# Patient Record
Sex: Male | Born: 1980 | Race: Black or African American | Hispanic: No
Health system: Southern US, Community
[De-identification: ages and names within clinical notes are randomized; demographics above are authoritative.]

---
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
## 2004-06-28 ENCOUNTER — Emergency Department (HOSPITAL_COMMUNITY): Admission: EM | Admit: 2004-06-28 | Discharge: 2004-06-28 | Payer: Self-pay | Admitting: Emergency Medicine

## 2004-09-24 ENCOUNTER — Emergency Department (HOSPITAL_COMMUNITY): Admission: EM | Admit: 2004-09-24 | Discharge: 2004-09-24 | Payer: Self-pay | Admitting: Emergency Medicine

## 2005-03-10 ENCOUNTER — Emergency Department (HOSPITAL_COMMUNITY): Admission: EM | Admit: 2005-03-10 | Discharge: 2005-03-10 | Payer: Self-pay | Admitting: Emergency Medicine

## 2005-06-28 ENCOUNTER — Emergency Department (HOSPITAL_COMMUNITY): Admission: EM | Admit: 2005-06-28 | Discharge: 2005-06-28 | Payer: Self-pay | Admitting: Family Medicine

## 2006-02-18 ENCOUNTER — Emergency Department (HOSPITAL_COMMUNITY): Admission: EM | Admit: 2006-02-18 | Discharge: 2006-02-18 | Payer: Self-pay | Admitting: Emergency Medicine

## 2006-11-04 ENCOUNTER — Emergency Department (HOSPITAL_COMMUNITY): Admission: EM | Admit: 2006-11-04 | Discharge: 2006-11-04 | Payer: Self-pay | Admitting: Emergency Medicine

## 2007-06-04 ENCOUNTER — Emergency Department (HOSPITAL_COMMUNITY): Admission: EM | Admit: 2007-06-04 | Discharge: 2007-06-04 | Payer: Self-pay | Admitting: Emergency Medicine

## 2007-09-02 ENCOUNTER — Emergency Department (HOSPITAL_COMMUNITY): Admission: EM | Admit: 2007-09-02 | Discharge: 2007-09-02 | Payer: Self-pay | Admitting: Emergency Medicine

## 2007-10-16 ENCOUNTER — Emergency Department (HOSPITAL_COMMUNITY): Admission: EM | Admit: 2007-10-16 | Discharge: 2007-10-16 | Payer: Self-pay | Admitting: Emergency Medicine

## 2009-11-28 IMAGING — CR DG HAND COMPLETE 3+V*R*
3 series · 3 of 3 positions shown · non-contrast
Comparison: 06/04/2007.

CLINICAL DATA: Right hand injury.  History of prior fracture.

RIGHT HAND - COMPLETE 3+ VIEW

[view not recorded (1 of 3)]
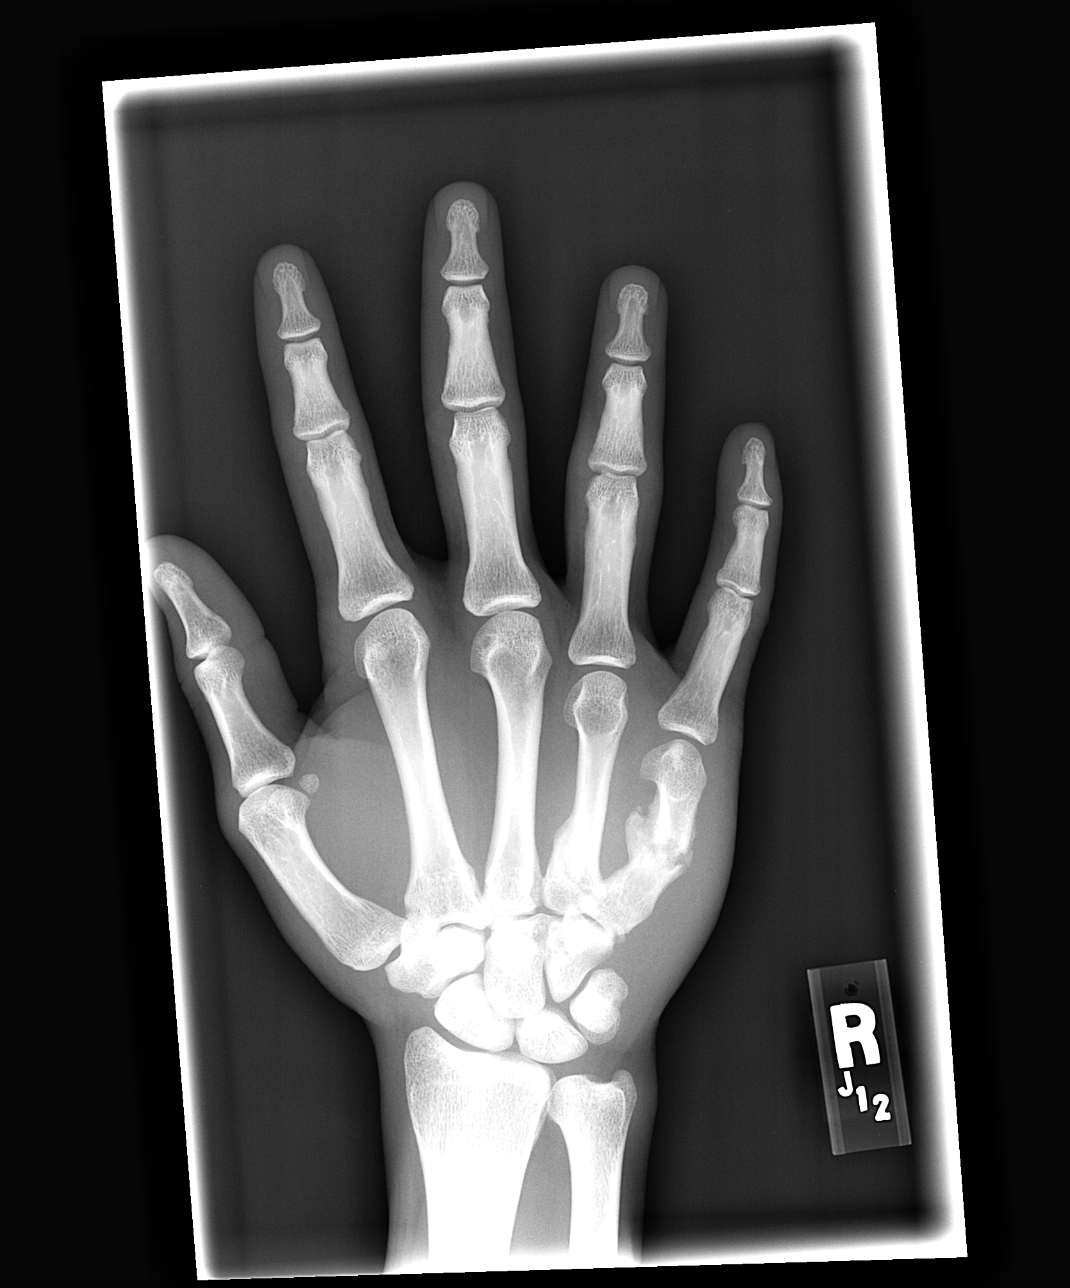

[view not recorded (2 of 3)]
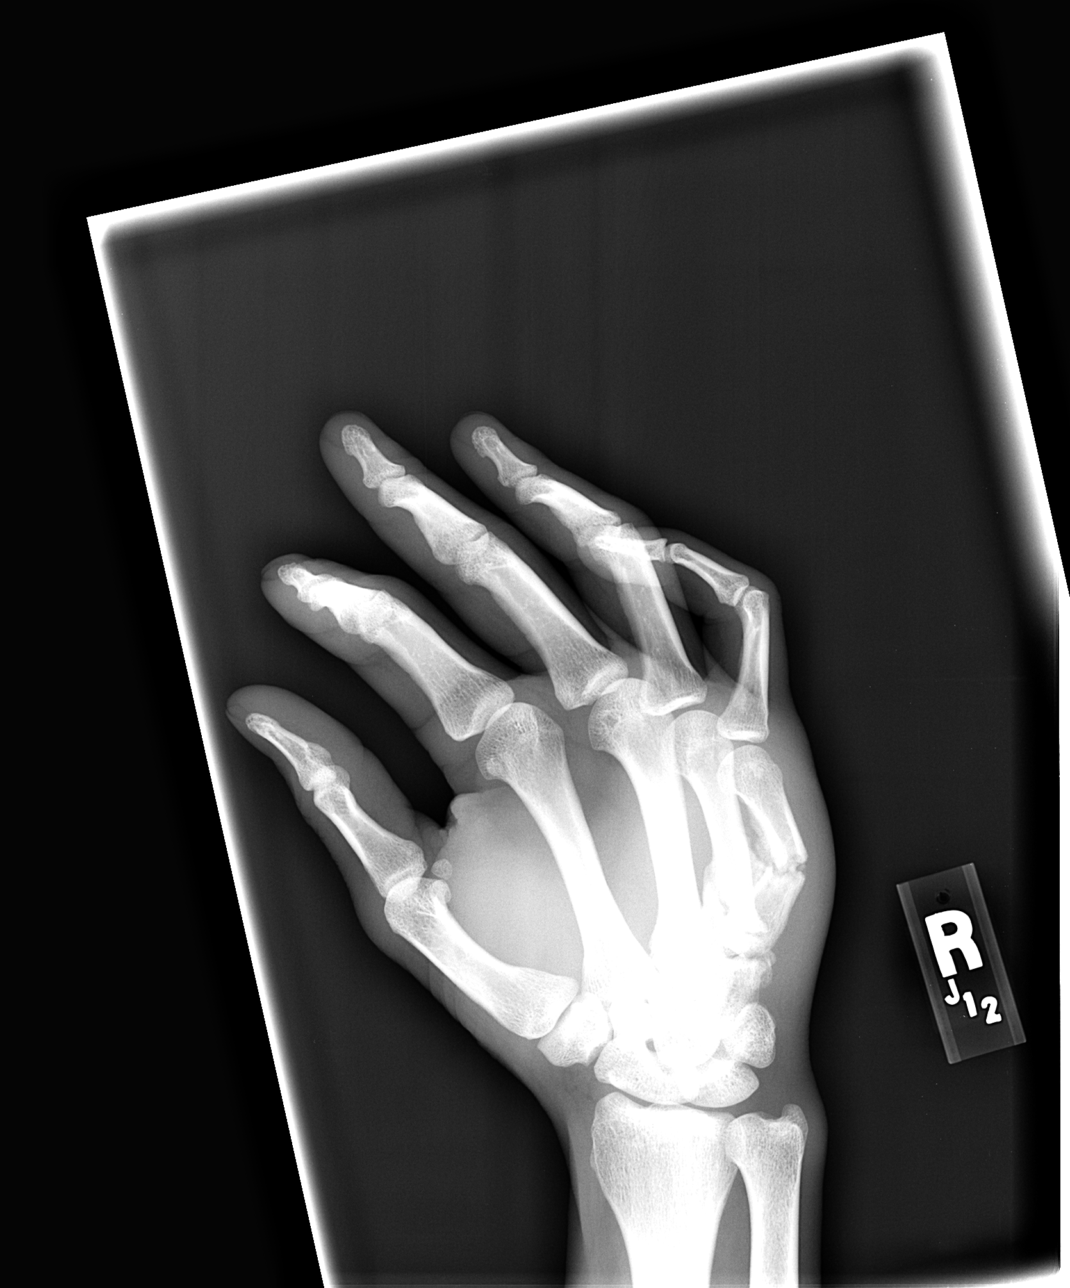

[view not recorded (3 of 3)]
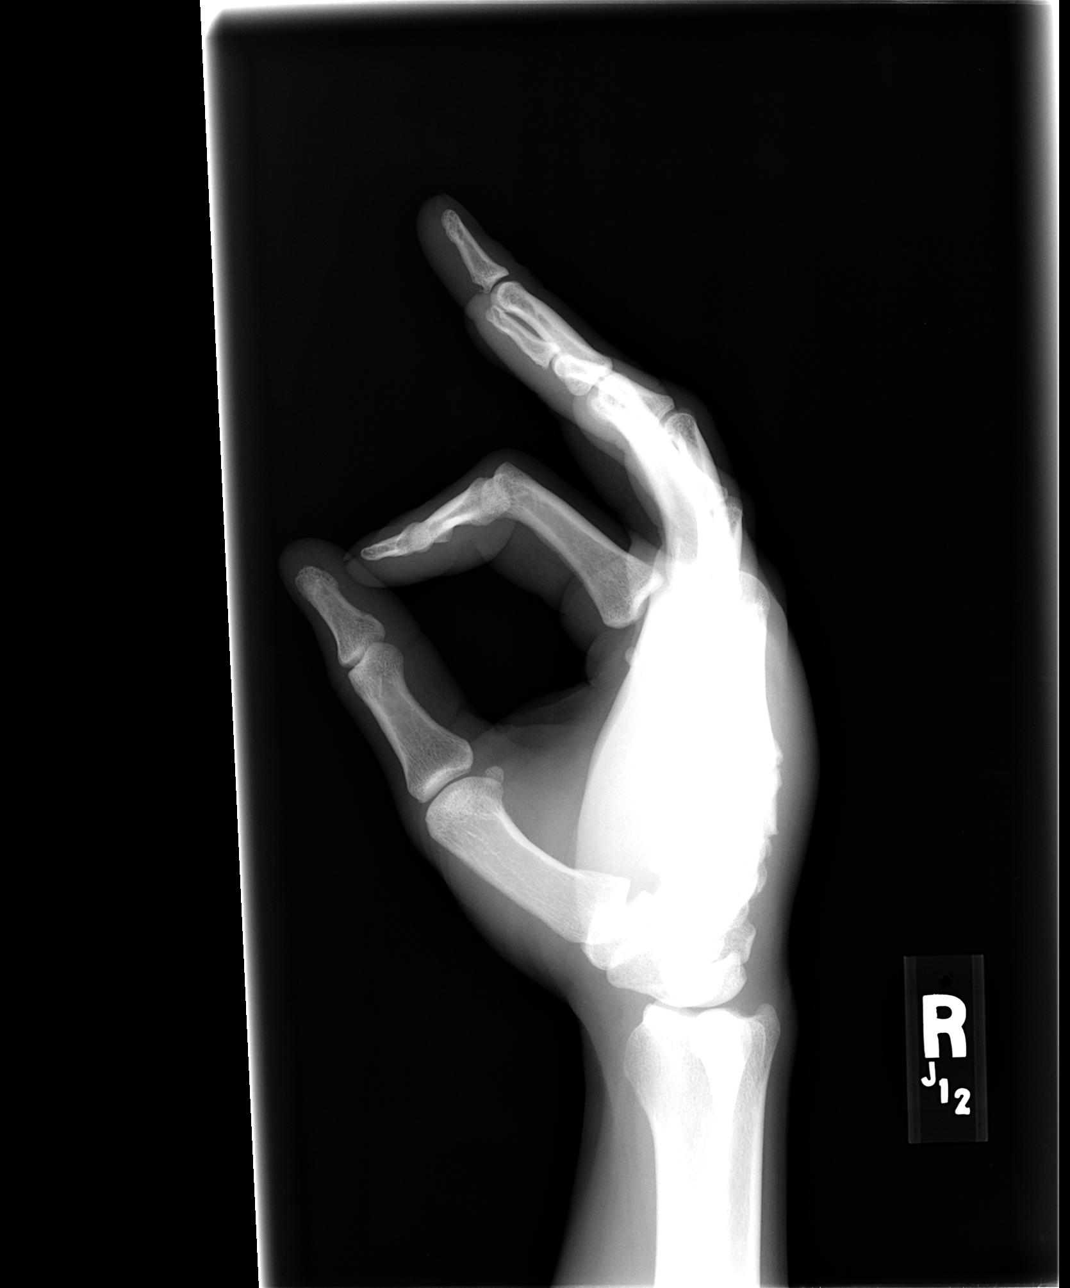

[3 of 3 positions shown; findings below may reference images not displayed]

FINDINGS: Remote fracture at the base of the right fourth
metacarpal.  Fracture of the right fifth metacarpal mid shaft noted
on the 06/04/2007 examination has not completely healed
radiographically with angulation and prominent bony overgrowth.
Otherwise no acute fracture or dislocation is noted.
IMPRESSION: Prior fractures right fourth and fifth metacarpal as discussed
above.

## 2010-08-08 NOTE — Op Note (Signed)
NAMEDRESDEN, LOZITO NO.:  [id number]   MEDICAL RECORD NO.:  [id number]          PATIENT TYPE:  EMS   LOCATION:  [facility]                         FACILITY:  [facility]   PHYSICIAN:  Juan Stokes, M.D.DATE OF BIRTH:  21-Jul-1980   DATE OF PROCEDURE:  11/04/2006  DATE OF DISCHARGE:                               OPERATIVE REPORT   HISTORY:  Juan Stokes presented to the emergency room in regards to  his right hand.  This patient is 30 year old right-hand-dominant male  who was referred by Juan Stokes in regards to his upper extremity  predicament.  The patient fell on a hearth tonight while rough-housing  with friends and sustained a fracture-dislocation of his 5th  carpometacarpal joint and 4th carpometacarpal joint.  The patient has a  significant deformity, has displacement of the 4th and 5th CMC joints.  He denies Stokes injury.  He has had no left upper extremity complaints.  I reviewed these findings at length.   At the present time, he is in severe pain.  He has difficulty evaluating  the deep motor branch to the ulnar nerve due to his pain.   ALLERGIES:  None.   MEDICINES:  None.   PAST SURGICAL HISTORY:  None.   PAST MEDICAL HISTORY:  None.   SOCIAL HISTORY:  The patient does not use any illicit drugs.  He  occasionally enjoys an alcoholic beverage and smokes one pack per day.  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   PHYSICAL EXAMINATION:  GENERAL:  He is alert and oriented, in no acute  distress.  VITAL SIGNS:  Stable.  LOWER EXTREMITIES:  The patient has a normal lower extremity  examination.  CHEST:  Clear.  HEART:  Regular rate.  UPPER EXTREMITIES:  The patient has intact sensation to the thumb,  index, middle, ring and small finger, no evidence of compartment  syndrome.  He has severe soft tissue deformity and a dorsal dislocation  of the 5th and 4th metacarpal.  The patient has had noted traumatic  injury here with the 4th and 5th  fracture dislocations of the [facility] joint.   I have reviewed his findings at length.  X-rays show a 4th and 5th  fracture dislocation.  He has severe deformity and dislocation is quite  prominent about the [facility] joint.   IMPRESSION:  Fracture dislocation, 4th and 5th carpometacarpal joint,  right hand (2 separate metacarpal fracture dislocations about the right  hand secondary to traumatic injury).   PLAN:  I have discussed the patient's findings.  At the present time, he  is in need of reduction and placement of the multiple bones and  dislocations back into an acceptable alignment.   DESCRIPTION OF PROCEDURE:  I consented him and following this, he  underwent an ulnar nerve block and superficial dorsal sensory ulnar  nerve block with lidocaine without epinephrine under sterile condition.  Once the wrist block was performed by myself, he then underwent  intervention in the form of a closed reduction.  The patient underwent a  closed reduction of the 4th followed by the 5th [facility]  joints.  The patient  had smooth reduction without difficulty and no iatrogenic injury was  imparted onto the area and the patient did quite well.  Once this was  done, we then took x-rays for post-reduction purposes and placed him in  a short arm cast, fiberglass in nature, well molded to my satisfaction,  with the wrist in 10 degrees of extension in the MCP joints in 60  degrees of flexion.  The postreduction x-rays showed adequate reduction  in the AP and lateral plane with much improved alignment.   IMPRESSION:  Fourth and fifth carpometacarpal fracture dislocation  treated with reduction.   PLAN:  I have discussed with the patient his findings.  At the present  time, we are going to continue immobilization and ice.  He will be  discharged home on Percocet p.r.n. pain.  I have discussed with him  vitamin C use as well as Peri-Colace use to prevent constipation.  He is  going to refrain from any heavy  activities, no lifting, gripping,  pushing, pulling, twisting and of course, return to the office for  regular followup.  We will likely plan for CT scan tonight to assess the  fracture in great detail and proceed accordingly with higher 2 level of  intervention as the reduction holds.  I have discussed with the patient  these issues at length, the do's and don'ts, etc, and all questions have  been encouraged and answered.  If any problems occur, he will notify me.  He left the emergency room, alert and oriented, in stable condition,  without signs of any neurovascular compromise.           ______________________________  Juan Stokes, M.D.     Juan Stokes  D:  11/04/2006  T:  11/05/2006  Job:  [id number]

## 2010-12-18 LAB — DIFFERENTIAL
Eosinophils Relative: 2
Lymphocytes Relative: 40
Lymphs Abs: 3.2
Monocytes Absolute: 0.7
Monocytes Relative: 8

## 2010-12-18 LAB — COMPREHENSIVE METABOLIC PANEL
AST: 30
Albumin: 3.9
Chloride: 103
Creatinine, Ser: 1.27 — ABNORMAL HIGH
GFR calc Af Amer: >60
Total Bilirubin: 0.7

## 2010-12-18 LAB — URINALYSIS, ROUTINE W REFLEX MICROSCOPIC
Bilirubin Urine: NEGATIVE
Hgb urine dipstick: NEGATIVE
Ketones, ur: NEGATIVE
Protein, ur: NEGATIVE
Urobilinogen, UA: 0.2

## 2010-12-18 LAB — CBC
MCV: 90
Platelets: 352
WBC: 8

## 2010-12-18 LAB — ETHANOL: Alcohol, Ethyl (B): 219 — ABNORMAL HIGH

## 2010-12-18 LAB — RAPID URINE DRUG SCREEN, HOSP PERFORMED
Amphetamines: NOT DETECTED
Opiates: NOT DETECTED
Tetrahydrocannabinol: POSITIVE — AB

## 2019-06-18 ENCOUNTER — Ambulatory Visit: Payer: Self-pay | Attending: Internal Medicine

## 2019-06-18 DIAGNOSIS — Z23 Encounter for immunization: Secondary | ICD-10-CM

## 2019-06-18 NOTE — Progress Notes (Signed)
   [facility]  Name:  Juan Stokes.    MRN: [id number] DOB: 29-Sep-1980  06/18/2019  [name] was observed post Covid-19 immunization for 15 minutes without incident. He was provided with Vaccine Information Sheet and instruction to access the V-Safe system.   [name] was instructed to call 911 with any severe reactions post vaccine: Marland Kitchen Difficulty breathing  . Swelling of face and throat  . A fast heartbeat  . A bad rash all over body  . Dizziness and weakness   Immunizations Administered    Name Date Dose VIS Date Route   Pfizer COVID-19 Vaccine 06/18/2019 11:40 AM 0.3 mL 03/06/2019 Intramuscular   Manufacturer: [employer], [employer]   Lot: [id number]   NDC: [address]-4

## 2019-07-13 ENCOUNTER — Ambulatory Visit: Payer: Self-pay | Attending: Internal Medicine

## 2019-07-13 DIAGNOSIS — Z23 Encounter for immunization: Secondary | ICD-10-CM

## 2019-07-13 NOTE — Progress Notes (Signed)
   [facility]  Name:  Juan Stokes.    MRN: [id number] DOB: [birth year]  07/13/2019  [name] was observed post Covid-19 immunization for 15 minutes without incident. He was provided with Vaccine Information Sheet and instruction to access the V-Safe system.   [name] was instructed to call 911 with any severe reactions post vaccine: Marland Kitchen Difficulty breathing  . Swelling of face and throat  . A fast heartbeat  . A bad rash all over body  . Dizziness and weakness   Immunizations Administered    Name Date Dose VIS Date Route   Pfizer COVID-19 Vaccine 07/13/2019 10:24 AM 0.3 mL 05/20/2018 Intramuscular   Manufacturer: [employer], [employer]   Lot: [id number]   NDC: [address]-2
